# Patient Record
Sex: Male | Born: 1935 | Race: White | Hispanic: No | Marital: Married | Smoking: Never smoker
Health system: Southern US, Community
[De-identification: ages and names within clinical notes are randomized; demographics above are authoritative.]

## PROBLEM LIST (undated history)

## (undated) DIAGNOSIS — F419 Anxiety disorder, unspecified: Secondary | ICD-10-CM

## (undated) DIAGNOSIS — R7301 Impaired fasting glucose: Secondary | ICD-10-CM

## (undated) DIAGNOSIS — F0390 Unspecified dementia without behavioral disturbance: Secondary | ICD-10-CM

## (undated) DIAGNOSIS — E7800 Pure hypercholesterolemia, unspecified: Secondary | ICD-10-CM

## (undated) DIAGNOSIS — R251 Tremor, unspecified: Secondary | ICD-10-CM

## (undated) HISTORY — PX: HERNIA REPAIR: SHX51

## (undated) HISTORY — DX: Impaired fasting glucose: R73.01

## (undated) HISTORY — DX: Anxiety disorder, unspecified: F41.9

## (undated) HISTORY — DX: Unspecified dementia, unspecified severity, without behavioral disturbance, psychotic disturbance, mood disturbance, and anxiety: F03.90

## (undated) HISTORY — DX: Pure hypercholesterolemia, unspecified: E78.00

## (undated) HISTORY — DX: Tremor, unspecified: R25.1

---

## 2007-01-06 ENCOUNTER — Encounter: Admission: RE | Admit: 2007-01-06 | Discharge: 2007-01-06 | Payer: Self-pay | Admitting: General Surgery

## 2015-04-19 DIAGNOSIS — R69 Illness, unspecified: Secondary | ICD-10-CM | POA: Diagnosis not present

## 2015-04-19 DIAGNOSIS — R251 Tremor, unspecified: Secondary | ICD-10-CM | POA: Diagnosis not present

## 2015-05-02 ENCOUNTER — Ambulatory Visit (INDEPENDENT_AMBULATORY_CARE_PROVIDER_SITE_OTHER): Payer: Medicare HMO | Admitting: Neurology

## 2015-05-02 ENCOUNTER — Encounter: Payer: Self-pay | Admitting: Neurology

## 2015-05-02 VITALS — BP 132/82 | HR 70 | Resp 16 | Ht 74.0 in | Wt 177.0 lb

## 2015-05-02 DIAGNOSIS — F0390 Unspecified dementia without behavioral disturbance: Secondary | ICD-10-CM

## 2015-05-02 DIAGNOSIS — G20 Parkinson's disease: Secondary | ICD-10-CM | POA: Diagnosis not present

## 2015-05-02 DIAGNOSIS — R69 Illness, unspecified: Secondary | ICD-10-CM | POA: Diagnosis not present

## 2015-05-02 NOTE — Patient Instructions (Addendum)
  Please remember, that any kind of tremor may be exacerbated by anxiety, anger, nervousness, excitement, dehydration, sleep deprivation, by caffeine, and low blood sugar values or blood sugar fluctuations. Some medications, especially some antidepressants and lithium can cause or exacerbate tremors. Tremors may temporarily calm down her subside with the use of a benzodiazepine such as Valium or related medications and with alcohol. Be aware however that drinking alcohol is not an approved treatment or appropriate treatment for tremor control and long-term use of benzodiazepines such as Valium, lorazepam, alprazolam, or clonazepam can cause habit formation, physical and psychological addiction.  We will monitor your symptoms of mild parkinsonism and we may start you on some medication for this down the road if needed, but for now, we will recheck in about 4 months.   Please talk to Dr. Tenny Crawoss about your ability to drive.

## 2015-05-02 NOTE — Progress Notes (Signed)
Subjective:    Patient ID: Stephens ShireCarlton C Kirby is a 82 y.o. male.  HPI     Huston FoleySaima Lonita Debes, MD, PhD [facility] [address], [address] [address] [place], [place] [id number]  Dear Dr. Tenny Crawoss,   I saw your patient, Tracie HarrierCarlton Kochanski, upon your kind request in my neurologic clinic today for initial consultation of his tremors, some concern for parkinsonism. The patient is accompanied by his wife today. As you know, Mr. Okey DupreLandreth is an 82 year old right-handed gentleman with an underlying medical history of depression, dementia, on generic Aricept 5 mg once daily, anxiety, hyperlipidemia, and impaired fasting glucose, who reports tremor of the hands, but cannot tell how long. His wife estimates that he has had a hand tremor for a few months. Tremor is more noticeable on the right. His handwriting has become more difficult to read, but he also adds that he has never had a good handwriting. His memory loss started a couple of years ago it sounds like. He has no family history of dementia or Parkinson's disease. There 80 year old son lives with them. Son does not drive after having had a car accident a few years ago and patient drives him to and back from work every day. The patient reports no issues driving. They live in a single story home. [place]. He is a nonsmoker, does not drink alcohol and drinks very little caffeine. He denies any hallucinations and there is no evidence of delusions of behavioral problems. He has a mild difficulty with fine motor skills and mild slowness is noted by wife but overall he maintains his activity level fairly well. He has not had any recent falls.   I reviewed your office note from 04/19/2015, which you kindly included. His latest MMSE in your office was 16 out of 29, clock drawing was 1 out of 4.  His Past Medical History Is Significant For: Past Medical History  Diagnosis Date  . Tremor   . Impaired fasting glucose   . Anxiety  disorder   . Dementia   . High cholesterol     His Past Surgical History Is Significant For: Past Surgical History  Procedure Laterality Date  . Hernia repair      His Family History Is Significant For: Family History  Problem Relation Age of Onset  . Diabetes Sister     His Social History Is Significant For: Social History   Social History  . Marital Status: Married    Spouse Name: N/A  . Number of Children: 2  . Years of Education: N/A   Occupational History  . Retired    Social History Main Topics  . Smoking status: Never Smoker   . Smokeless tobacco: None  . Alcohol Use: No  . Drug Use: No  . Sexual Activity: Not Asked   Other Topics Concern  . None   Social History Narrative   Occasionally drinks coffee     His Allergies Are:  No Known Allergies:   His Current Medications Are:  Outpatient Encounter Prescriptions as of 05/02/2015  Medication Sig  . B Complex-Biotin-FA (B-COMPLEX PO) Take by mouth.  . donepezil (ARICEPT) 5 MG tablet   . escitalopram (LEXAPRO) 10 MG tablet    No facility-administered encounter medications on file as of 05/02/2015.   Review of Systems:  Out of a complete 14 point review of systems, all are reviewed and negative with the exception of these symptoms as listed below:   Review of  Systems  Neurological:       Memory loss.  Patient reports that he has tremors in both hands, states that sometimes it is worse than others.   Hematological: Bruises/bleeds easily.    Objective:  Neurologic Exam  Physical Exam Physical Examination:   Filed Vitals:   05/02/15 0851  BP: 132/82  Pulse: 70  Resp: 16    General Examination: The patient is a very pleasant 82 y.o. male in no acute distress. He appears well-developed and well-nourished and well groomed.   HEENT: Normocephalic, atraumatic, pupils are equal, round and reactive to light and accommodation. Extraocular tracking is Mildly saccadic, perhaps limitation to upper  gaze. Hearing is mildly impaired with bilateral hearing aids in place. Face is symmetric with perhaps mild facial masking noted.There is no lip, neck/head, jaw or voice tremor. Neck is very mildly rigid with full range of passive and active motion. There are no carotid bruits on auscultation. Oropharynx exam reveals: mild mouth dryness, adequate dental hygiene. Tongue protrudes centrally. Speech is mildly hypophonic.   Chest: Clear to auscultation without wheezing, rhonchi or crackles noted.  Heart: S1+S2+0, regular and normal without murmurs, rubs or gallops noted.   Abdomen: Soft, non-tender and non-distended with normal bowel sounds appreciated on auscultation.  Extremities: There is trace pitting edema in the distal lower extremities bilaterally. Pedal pulses are intact.  Skin: Warm and dry without trophic changes noted.   Musculoskeletal: exam reveals no obvious joint deformities, tenderness or joint swelling or erythema.   Neurologically:  Mental status: The patient is awake, alert and oriented in all 4 spheres. His immediate and remote memory, attention, language skills and fund of knowledge are impaired. There is no evidence of aphasia, agnosia, apraxia or anomia. Thought process is linear. Mood is normal and affect is normal.  Cranial nerves II - XII are as described above under HEENT exam. In addition: shoulder shrug is normal with equal shoulder height noted. Motor exam: Normal bulk, strength. There is no drift, resting tremor or rebound. He has a very minimal action tremor and postural tremor in both upper extremities, left a little worse than right. On Archimedes spiral drawing there is minimal tremulousness noted bilaterally. Handwriting is small and tremulous but legible. Reflexes are 1+ throughout. Fine motor skills and coordination:  he has mild impairment with finger taps, rapid alternating movements and hand movements on the right, minimal impairment on the left, foot agility and  foot taps are mildly impaired on the right and minimally so on the left. Heel-to-shin is not possible for him, Romberg is not testable because he has trouble standing narrow based. Tandem walk is also therefore not possible.   Cerebellar testing: No dysmetria or intention tremor on finger to nose testing. Heel to shin is unremarkable bilaterally. There is no truncal or gait ataxia.  Sensory exam: intact to light touch, pinprick, vibration, temperature sense in the upper and lower extremities.  Gait, station and balance:  He stands up without significant difficulty and does not have to push up with his hands. He stands with a mildly stooped posture and slight tilt to the left. He denies any lightheadedness. He walks fairly well, perhaps slight decrease in arm swing on the right, he turns well, balance seems preserved for age.   Assessment and Plan:     In summary, NISAIAH BECHTOL is a very pleasant 82 y.o.-year old male with an underlying medical history of depression, dementia, on generic Aricept 5 mg once daily, anxiety, hyperlipidemia, and  impaired fasting glucose, who presents for initial consultation of his tremors, some concern for parkinsonism. On examination, he does have some mild signs of parkinsonism, perhaps with right-sided lateralization. He has a history of memory loss which does not date very far back as I understand. He has been on low-dose donepezil for this. His tremor started a few months ago. Parkinsonism is mild at this time, is not interfering with his activities of daily living and I suggested reevaluation in 3-4 months. We may resort to trying low-dose Sinemet down the road if needed. He is encouraged to talk to you about his driving abilities. His memory loss may impair his driving skills and he currently drives his son 1 and back from work every day and I worry about this a little bit. His wife has not noticed any problem with his driving which is reassuring. At this juncture, I  suggested we monitor his symptoms and recheck in about 4 months. Please make sure that with his recent blood work he has had thyroid function tested and that his B12 and vitamin D levels are up-to-date as well.  I had a long chat with the patient and his wife about my findings and the diagnosis of parkinsonism, the prognosis and treatment options. We talked about medical treatments and non-pharmacological approaches. We talked about maintaining a healthy lifestyle in general. I encouraged the patient to eat healthy, exercise daily and keep well hydrated, to keep a scheduled bedtime and wake time routine, to not skip any meals and eat healthy snacks in between meals.  I answered all their questions today and the patient and his wife were in agreement with the above outlined plan.  Thank you very much for allowing me to participate in the care of this nice patient. If I can be of any further assistance to you please do not hesitate to call me at [phone].  Sincerely,   Huston Foley, MD, PhD

## 2015-08-31 ENCOUNTER — Ambulatory Visit (INDEPENDENT_AMBULATORY_CARE_PROVIDER_SITE_OTHER): Payer: Medicare HMO | Admitting: Neurology

## 2015-08-31 ENCOUNTER — Encounter: Payer: Self-pay | Admitting: Neurology

## 2015-08-31 VITALS — BP 166/73 | HR 52 | Resp 16 | Ht 74.0 in | Wt 177.0 lb

## 2015-08-31 DIAGNOSIS — G20 Parkinson's disease: Secondary | ICD-10-CM

## 2015-08-31 NOTE — Progress Notes (Signed)
Subjective:    Patient ID: Darryl Ayala is a 82 y.o. male.  HPI     Interim history:  Darryl Ayala is an 82 year old right-handed gentleman with an underlying medical history of depression, dementia, on generic Aricept 5 mg once daily, anxiety, hyperlipidemia, and impaired fasting glucose, who presents for follow-up consultation of his tremors, mild parkinsonism. The patient is accompanied by his wife today. I first met him on 05/02/2015 at the request of his primary care physician, at which time the patient reported tremors of both hands. On examination he had mild signs of parkinsonism, with mild lateralization to the right. We mutually agreed to continue to monitor symptoms.   Today, 08/31/2015: He reports no new symptoms, stays active. Wife reports that he will be outside sometimes in the heat. He drinks water throughout the day he says. Wife reports no additional new observations and memory appears to be stable. No recent falls.  Previously:   05/02/2015: He reports tremor of the hands, but cannot tell how long. His wife estimates that he has had a hand tremor for a few months. Tremor is more noticeable on the right. His handwriting has become more difficult to read, but he also adds that he has never had a good handwriting. His memory loss started a couple of years ago it sounds like. He has no family history of dementia or Parkinson's disease. There 3 year old son lives with them. Son does not drive after having had a car accident a few years ago and patient drives him to and back from work every day. The patient reports no issues driving. They live in a single story home. He [place] farm. He is a nonsmoker, does not drink alcohol and drinks very little caffeine. He denies any hallucinations and there is no evidence of delusions of behavioral problems. He has a mild difficulty with fine motor skills and mild slowness is noted by wife but overall he maintains his activity level  fairly well. He has not had any recent falls.    I reviewed your office note from 04/19/2015, which you kindly included. His latest MMSE in your office was 16 out of 29, clock drawing was 1 out of 4.   His Past Medical History Is Significant For: Past Medical History:  Diagnosis Date  . Anxiety disorder   . Dementia   . High cholesterol   . Impaired fasting glucose   . Tremor     His Past Surgical History Is Significant For: Past Surgical History:  Procedure Laterality Date  . HERNIA REPAIR      His Family History Is Significant For: Family History  Problem Relation Age of Onset  . Diabetes Sister     His Social History Is Significant For: Social History   Social History  . Marital status: Married    Spouse name: N/A  . Number of children: 2  . Years of education: N/A   Occupational History  . Retired    Social History Main Topics  . Smoking status: Never Smoker  . Smokeless tobacco: None  . Alcohol use No  . Drug use: No  . Sexual activity: Not Asked   Other Topics Concern  . None   Social History Narrative   Occasionally drinks coffee     His Allergies Are:  No Known Allergies:   His Current Medications Are:  Outpatient Encounter Prescriptions as of 08/31/2015  Medication Sig  . B Complex-Biotin-FA (B-COMPLEX PO) Take by mouth.  Marland Kitchen  donepezil (ARICEPT) 5 MG tablet   . escitalopram (LEXAPRO) 10 MG tablet    No facility-administered encounter medications on file as of 08/31/2015.   :  Review of Systems:  Out of a complete 14 point review of systems, all are reviewed and negative with the exception of these symptoms as listed below:  Review of Systems  Neurological:       Patient is here for f/u. No new concerns.     Objective:  Neurologic Exam  Physical Exam Physical Examination:   Vitals:   08/31/15 0937  BP: (!) 166/73  Pulse: (!) 52  Resp: 16   General Examination: The patient is a very pleasant 82 y.o. male in no acute distress. He  appears well-developed and well-nourished and well groomed. He was waiting for me and was irritated, and I apologized for the delay.   HEENT: Normocephalic, atraumatic, pupils are equal, round and reactive to light and accommodation. Extraocular tracking is Mildly saccadic, perhaps limitation to upper gaze. S/P b/l cataract repairs, hearing is impaired with no hearing aids in place. Face is symmetric with perhaps mild facial masking noted.There is no lip, neck/head, jaw or voice tremor. Neck is very mildly rigid with full range of passive and active motion. There are no carotid bruits on auscultation. Oropharynx exam reveals: mild mouth dryness, adequate dental hygiene. Tongue protrudes centrally. Speech is mildly hypophonic.   Chest: Clear to auscultation without wheezing, rhonchi or crackles noted.  Heart: S1+S2+0, regular and normal without murmurs, rubs or gallops noted.   Abdomen: Soft, non-tender and non-distended with normal bowel sounds appreciated on auscultation.  Extremities: There is trace pitting edema in the distal lower extremities bilaterally, around the ankles. Pedal pulses are intact.  Skin: Warm and dry without trophic changes noted. Mild bruising of hands and forearms.   Musculoskeletal: exam reveals no obvious joint deformities, tenderness or joint swelling or erythema.   Neurologically:  Mental status: The patient is awake, alert and oriented in all 4 spheres. His immediate and remote memory, attention, language skills and fund of knowledge are impaired. There is no evidence of aphasia, agnosia, apraxia or anomia. Thought process is linear. Mood is normal and affect is normal.  Cranial nerves II - XII are as described above under HEENT exam. In addition: shoulder shrug is normal with equal shoulder height noted. Motor exam: Normal bulk, strength. There is no drift, resting tremor or rebound. He has a very minimal action tremor and postural tremor in both upper extremities,  left a little worse than right.  (First visit: On Archimedes spiral drawing there is minimal tremulousness noted bilaterally. Handwriting is small and tremulous but legible). Reflexes are 1+ throughout. Fine motor skills and coordination:  he has mild impairment with finger taps, rapid alternating movements and hand movements on the right, minimal impairment on the left, foot agility and foot taps are mildly impaired on the right and minimally so on the left. Heel-to-shin is not possible for him, Romberg is not testable because he has trouble standing narrow based. Tandem walk is also therefore not possible.   Cerebellar testing: No dysmetria or intention tremor on finger to nose testing. Heel to shin is unremarkable for age.  Sensory exam: intact to light touch in the upper and lower extremities.  Gait, station and balance:  He stands up without significant difficulty and does not have to push up with his hands. He stands with a mildly stooped posture and slight tilt to the left. He denies any  lightheadedness. He walks fairly well, perhaps slight decrease in arm swing on the right, he turns well, balance seems preserved for age.   Assessment and Plan:     In summary, Darryl Ayala is a very pleasant 82 year old male with an underlying medical history of depression, dementia, on generic Aricept 5 mg once daily, anxiety, hyperlipidemia, and impaired fasting glucose, who presents for Follow-up consultation of his tremors, mild parkinsonism. On examination, he has stable findings with mild parkinsonism, some lateralization to the right. He is not keen on trying any new medications at this time. He is advised to stay active physically but avoid heat exposure and stay better hydrated with water. I suggested a six-month checkup but he would like to wait, we mutually agreed for an as needed follow-up at this time. I answered all her questions today and the patient and his wife were in agreement. I spent 25  minutes in total face-to-face time with the patient, more than 50% of which was spent in counseling and coordination of care, reviewing test results, reviewing medication and discussing or reviewing the diagnosis of parkinsonism, its prognosis and treatment options.

## 2015-08-31 NOTE — Patient Instructions (Signed)
Your exam is stable.  We can monitor your symptoms.  Please stay well hydrated with water.  Avoid heat exposure.  As discussed, I would be happy to see you back as needed.

## 2016-01-11 DIAGNOSIS — Z23 Encounter for immunization: Secondary | ICD-10-CM | POA: Diagnosis not present

## 2016-01-11 DIAGNOSIS — R7301 Impaired fasting glucose: Secondary | ICD-10-CM | POA: Diagnosis not present

## 2016-01-11 DIAGNOSIS — R69 Illness, unspecified: Secondary | ICD-10-CM | POA: Diagnosis not present

## 2016-01-11 DIAGNOSIS — Z0000 Encounter for general adult medical examination without abnormal findings: Secondary | ICD-10-CM | POA: Diagnosis not present

## 2016-01-11 DIAGNOSIS — E7800 Pure hypercholesterolemia, unspecified: Secondary | ICD-10-CM | POA: Diagnosis not present

## 2017-02-15 DIAGNOSIS — Z23 Encounter for immunization: Secondary | ICD-10-CM | POA: Diagnosis not present

## 2017-02-15 DIAGNOSIS — R69 Illness, unspecified: Secondary | ICD-10-CM | POA: Diagnosis not present

## 2017-02-15 DIAGNOSIS — E7800 Pure hypercholesterolemia, unspecified: Secondary | ICD-10-CM | POA: Diagnosis not present

## 2017-02-15 DIAGNOSIS — R7301 Impaired fasting glucose: Secondary | ICD-10-CM | POA: Diagnosis not present

## 2017-02-15 DIAGNOSIS — R251 Tremor, unspecified: Secondary | ICD-10-CM | POA: Diagnosis not present

## 2017-02-15 DIAGNOSIS — Z0000 Encounter for general adult medical examination without abnormal findings: Secondary | ICD-10-CM | POA: Diagnosis not present

## 2017-03-28 DIAGNOSIS — R251 Tremor, unspecified: Secondary | ICD-10-CM | POA: Diagnosis not present

## 2017-03-28 DIAGNOSIS — R69 Illness, unspecified: Secondary | ICD-10-CM | POA: Diagnosis not present

## 2017-05-06 ENCOUNTER — Encounter (HOSPITAL_COMMUNITY): Payer: Self-pay

## 2017-05-06 ENCOUNTER — Emergency Department (HOSPITAL_COMMUNITY): Payer: Medicare HMO

## 2017-05-06 ENCOUNTER — Other Ambulatory Visit: Payer: Self-pay

## 2017-05-06 ENCOUNTER — Emergency Department (HOSPITAL_COMMUNITY)
Admission: EM | Admit: 2017-05-06 | Discharge: 2017-05-07 | Disposition: A | Discharge status: Home / Self Care | Payer: Medicare HMO | Attending: Emergency Medicine | Admitting: Emergency Medicine

## 2017-05-06 DIAGNOSIS — F0280 Dementia in other diseases classified elsewhere without behavioral disturbance: Secondary | ICD-10-CM | POA: Diagnosis not present

## 2017-05-06 DIAGNOSIS — Z79899 Other long term (current) drug therapy: Secondary | ICD-10-CM | POA: Diagnosis not present

## 2017-05-06 DIAGNOSIS — R404 Transient alteration of awareness: Secondary | ICD-10-CM | POA: Diagnosis not present

## 2017-05-06 DIAGNOSIS — R2681 Unsteadiness on feet: Secondary | ICD-10-CM | POA: Diagnosis not present

## 2017-05-06 DIAGNOSIS — R531 Weakness: Secondary | ICD-10-CM | POA: Diagnosis not present

## 2017-05-06 DIAGNOSIS — R4182 Altered mental status, unspecified: Secondary | ICD-10-CM | POA: Diagnosis not present

## 2017-05-06 DIAGNOSIS — G309 Alzheimer's disease, unspecified: Secondary | ICD-10-CM | POA: Diagnosis not present

## 2017-05-06 DIAGNOSIS — R69 Illness, unspecified: Secondary | ICD-10-CM | POA: Diagnosis not present

## 2017-05-06 LAB — CBC WITH DIFFERENTIAL/PLATELET
Basophils Absolute: 0 10*3/uL (ref 0.0–0.1)
Basophils Relative: 0 %
EOS ABS: 0 10*3/uL (ref 0.0–0.7)
EOS PCT: 1 %
HCT: 38.5 % — ABNORMAL LOW (ref 39.0–52.0)
Hemoglobin: 12.9 g/dL — ABNORMAL LOW (ref 13.0–17.0)
LYMPHS ABS: 1.4 10*3/uL (ref 0.7–4.0)
Lymphocytes Relative: 24 %
MCH: 30.9 pg (ref 26.0–34.0)
MCHC: 33.5 g/dL (ref 30.0–36.0)
MCV: 92.1 fL (ref 78.0–100.0)
MONOS PCT: 7 %
Monocytes Absolute: 0.4 10*3/uL (ref 0.1–1.0)
Neutro Abs: 4.1 10*3/uL (ref 1.7–7.7)
Neutrophils Relative %: 68 %
PLATELETS: 170 10*3/uL (ref 150–400)
RBC: 4.18 MIL/uL — ABNORMAL LOW (ref 4.22–5.81)
RDW: 12.3 % (ref 11.5–15.5)
WBC: 6 10*3/uL (ref 4.0–10.5)

## 2017-05-06 LAB — COMPREHENSIVE METABOLIC PANEL
ALT: 48 U/L (ref 17–63)
AST: 78 U/L — ABNORMAL HIGH (ref 15–41)
Albumin: 3.7 g/dL (ref 3.5–5.0)
Alkaline Phosphatase: 63 U/L (ref 38–126)
Anion gap: 7 (ref 5–15)
BUN: 23 mg/dL — ABNORMAL HIGH (ref 6–20)
CHLORIDE: 107 mmol/L (ref 101–111)
CO2: 31 mmol/L (ref 22–32)
CREATININE: 1 mg/dL (ref 0.61–1.24)
Calcium: 9.1 mg/dL (ref 8.9–10.3)
GFR calc Af Amer: >60 mL/min (ref >60)
Glucose, Bld: 108 mg/dL — ABNORMAL HIGH (ref 65–99)
Potassium: 3.7 mmol/L (ref 3.5–5.1)
SODIUM: 145 mmol/L (ref 135–145)
Total Bilirubin: 1.1 mg/dL (ref 0.3–1.2)
Total Protein: 6.8 g/dL (ref 6.5–8.1)

## 2017-05-06 LAB — URINALYSIS, ROUTINE W REFLEX MICROSCOPIC
Bilirubin Urine: NEGATIVE
Glucose, UA: NEGATIVE mg/dL
HGB URINE DIPSTICK: NEGATIVE
Ketones, ur: 20 mg/dL — AB
Leukocytes, UA: NEGATIVE
Nitrite: NEGATIVE
PROTEIN: NEGATIVE mg/dL
Specific Gravity, Urine: 1.031 — ABNORMAL HIGH (ref 1.005–1.030)
pH: 5 (ref 5.0–8.0)

## 2017-05-06 LAB — I-STAT TROPONIN, ED: TROPONIN I, POC: 0.02 ng/mL (ref 0.00–0.08)

## 2017-05-06 NOTE — ED Notes (Signed)
Bed: WA19 Expected date:  Expected time:  Means of arrival:  Comments: EMS 

## 2017-05-06 NOTE — ED Provider Notes (Signed)
[facility]-EMERGENCY DEPT Provider Note   CSN: [id number] Arrival date & time: 05/06/17  1735     History   Chief Complaint Chief Complaint  Patient presents with  . Weakness    HPI NASARIO CZERNIAK is a 84 y.o. male.  The history is provided by the spouse. No language interpreter was used.  Weakness     KENSINGTON DUERST is a 84 y.o. male who presents to the Emergency Department complaining of weakness. Level V caveat due to dementia. History is provided by the patient's wife. His wife called 911 today because he has experienced progressive weakness over the last two weeks. He has a history of Alzheimer's and has had difficulty walking but has been fairly functional until about two weeks ago. Since that time he has had significant worsening and gait instability and she is needing to assist him with standing up and walking. Today he has been unable to walk at all. No reports of fevers, vomiting, abdominal pain, diarrhea, cough.  Past Medical History:  Diagnosis Date  . Anxiety disorder   . Dementia   . High cholesterol   . Impaired fasting glucose   . Tremor     There are no active problems to display for this patient.   Past Surgical History:  Procedure Laterality Date  . HERNIA REPAIR          Home Medications    Prior to Admission medications   Medication Sig Start Date End Date Taking? Authorizing Provider  B Complex-Biotin-FA (B-COMPLEX PO) Take by mouth.    [provider]  citalopram (CELEXA) 20 MG tablet Take 20 mg by mouth daily.  04/30/17   [provider]  donepezil (ARICEPT) 5 MG tablet Take 5 mg by mouth daily.  04/12/15   [provider]  primidone (MYSOLINE) 50 MG tablet Take 50 mg by mouth 3 (three) times daily.  04/08/17   [provider]    Family History Family History  Problem Relation Age of Onset  . Diabetes Sister     Social History Social History   Tobacco Use  . Smoking  status: Never Smoker  . Smokeless tobacco: Current User  Substance Use Topics  . Alcohol use: No    Alcohol/week: 0.0 oz  . Drug use: No     Allergies   Patient has no known allergies.   Review of Systems Review of Systems  Neurological: Positive for weakness.  All other systems reviewed and are negative.    Physical Exam Updated Vital Signs BP 104/64 (BP Location: Left Arm)   Pulse (!) 55   Temp 98 F (36.7 C) (Oral)   Resp 19   Ht 6' (1.829 m)   Wt 81.6 kg (180 lb)   SpO2 96%   BMI 24.41 kg/m   Physical Exam  Constitutional: He appears well-developed and well-nourished.  HENT:  Head: Normocephalic and atraumatic.  Cardiovascular: Normal rate and regular rhythm.  No murmur heard. Pulmonary/Chest: Effort normal and breath sounds normal. No respiratory distress.  Abdominal: Soft. There is no tenderness. There is no rebound and no guarding.  Musculoskeletal: He exhibits no edema or tenderness.  Neurological:  Drowsy but arousable to verbal stimuli. Markedly confused with dysarthritic speech. Disoriented to place and time. 4 to 5 strength in all four extremities. No facial asymmetry. Generalized weakness.  Tremor on intention in bilateral upper extremities.    Skin: Skin is warm and dry.  Psychiatric:  Unable to assess  Nursing note and vitals reviewed.    ED Treatments / Results  Labs (all labs ordered are listed, but only abnormal results are displayed) Labs Reviewed  COMPREHENSIVE METABOLIC PANEL - Abnormal; Notable for the following components:      Result Value   Glucose, Bld 108 (*)    BUN 23 (*)    AST 78 (*)    All other components within normal limits  CBC WITH DIFFERENTIAL/PLATELET - Abnormal; Notable for the following components:   RBC 4.18 (*)    Hemoglobin 12.9 (*)    HCT 38.5 (*)    All other components within normal limits  URINALYSIS, ROUTINE W REFLEX MICROSCOPIC - Abnormal; Notable for the following components:   Color, Urine AMBER (*)     Specific Gravity, Urine 1.031 (*)    Ketones, ur 20 (*)    All other components within normal limits  URINE CULTURE  I-STAT TROPONIN, ED    EKG EKG Interpretation  Date/Time:  Monday May 06 2017 21:41:23 EDT Ventricular Rate:  52 PR Interval:    QRS Duration: 81 QT Interval:  442 QTC Calculation: 411 R Axis:   75 Text Interpretation:  Sinus rhythm Nonspecific T abnrm, anterolateral leads Confirmed by Tilden Fossa [phone]) on 05/06/2017 9:43:53 PM   Radiology Dg Chest 2 View  Result Date: 05/06/2017 CLINICAL DATA:  Generalized weakness with dementia which is worsening. EXAM: CHEST - 2 VIEW COMPARISON:  01/06/2007 FINDINGS: Heart size is normal. Mediastinal shadows are normal for age. Lungs are clear. The vascularity is normal. No effusions. No significant bone finding. IMPRESSION: No active cardiopulmonary disease. Electronically Signed   By: Paulina Fusi M.D.   On: 05/06/2017 20:16   Ct Head Wo Contrast  Result Date: 05/06/2017 CLINICAL DATA:  Altered mental status. History of Alzheimer's disease. EXAM: CT HEAD WITHOUT CONTRAST TECHNIQUE: Contiguous axial images were obtained from the base of the skull through the vertex without intravenous contrast. COMPARISON:  None. FINDINGS: Brain: No evidence of acute infarction, hemorrhage, hydrocephalus, extra-axial collection or mass lesion/mass effect. Mild generalized cerebral atrophy. Mild periventricular and subcortical white matter hypodensities are nonspecific but favored to reflect chronic microvascular ischemic changes. Vascular: Atherosclerotic vascular calcification of the carotid siphons. No hyperdense vessel. Skull: Normal. Negative for fracture or focal lesion. Sinuses/Orbits: No acute finding. Other: None. IMPRESSION: 1.  No acute intracranial abnormality. 2. Mild generalized cerebral atrophy and chronic microvascular ischemic changes. Electronically Signed   By: Obie Dredge M.D.   On: 05/06/2017 20:20     Procedures Procedures (including critical care time)  Medications Ordered in ED Medications - No data to display   Initial Impression / Assessment and Plan / ED Course  I have reviewed the triage vital signs and the nursing notes.  Pertinent labs & imaging results that were available during my care of the patient were reviewed by me and considered in my medical decision making (see chart for details).     With history of Alzheimer's disease and dementia here for evaluation of weakness and inability to ambulate. He is chronically ill appearing on examination with no focal neurologic deficits. There is no evidence of acute infectious process. No evidence of acute CVA. Discussed with hospitalist regarding observation admission for progressive weakness and inability to ambulate - per hospitalist he does not meet admission criteria. Social work consulted for assistance with placement. Patient and family updated findings of studies and they are in agreement with plan.  Final Clinical Impressions(s) / ED Diagnoses   Final  diagnoses:  None    ED Discharge Orders    None       Tilden Fossaees, Layken Doenges, MD 05/07/17 [phone]

## 2017-05-06 NOTE — ED Notes (Signed)
Bed: [id number] Expected date:  Expected time:  Means of arrival:  Comments: EMS [age] M AMS hx dementia

## 2017-05-06 NOTE — ED Triage Notes (Signed)
Pt arrived via EMS from Home pt lives with wife. Pt has increased generalized weakness with decreased cognitive fxn x past 2 weeks. Pt has hx of Alzheimers  spouse states that pt has increased physical demands. Pt spouse reports to EMS that she is not able to take care of pt at this point, and would like placement to SNF options.

## 2017-05-06 NOTE — ED Notes (Addendum)
Pt is alert to self and is aware he is in the hospital, pt  wife is at beside accompanied with pt sister-in law. Pt wife reports that pt has gone downhill for the past two week, and reports that pt was driving 2 months ago and was at his baseline.

## 2017-05-07 ENCOUNTER — Encounter (HOSPITAL_COMMUNITY): Payer: Self-pay

## 2017-05-07 DIAGNOSIS — G250 Essential tremor: Secondary | ICD-10-CM | POA: Diagnosis not present

## 2017-05-07 DIAGNOSIS — G309 Alzheimer's disease, unspecified: Secondary | ICD-10-CM | POA: Diagnosis not present

## 2017-05-07 DIAGNOSIS — Z79899 Other long term (current) drug therapy: Secondary | ICD-10-CM | POA: Diagnosis not present

## 2017-05-07 DIAGNOSIS — R41841 Cognitive communication deficit: Secondary | ICD-10-CM | POA: Diagnosis not present

## 2017-05-07 DIAGNOSIS — R531 Weakness: Secondary | ICD-10-CM | POA: Diagnosis not present

## 2017-05-07 DIAGNOSIS — R2689 Other abnormalities of gait and mobility: Secondary | ICD-10-CM | POA: Diagnosis not present

## 2017-05-07 DIAGNOSIS — E785 Hyperlipidemia, unspecified: Secondary | ICD-10-CM | POA: Diagnosis not present

## 2017-05-07 DIAGNOSIS — R2681 Unsteadiness on feet: Secondary | ICD-10-CM | POA: Diagnosis not present

## 2017-05-07 DIAGNOSIS — R4182 Altered mental status, unspecified: Secondary | ICD-10-CM | POA: Diagnosis not present

## 2017-05-07 DIAGNOSIS — M6281 Muscle weakness (generalized): Secondary | ICD-10-CM | POA: Diagnosis not present

## 2017-05-07 DIAGNOSIS — R69 Illness, unspecified: Secondary | ICD-10-CM | POA: Diagnosis not present

## 2017-05-07 MED ORDER — PRIMIDONE 50 MG PO TABS
50.0000 mg | ORAL_TABLET | Freq: Three times a day (TID) | ORAL | Status: DC
  Administered 2017-05-07 (×2): 50 mg via ORAL
  Filled 2017-05-07 (×4): qty 1

## 2017-05-07 MED ORDER — CITALOPRAM HYDROBROMIDE 10 MG PO TABS
20.0000 mg | ORAL_TABLET | Freq: Every day | ORAL | Status: DC
  Administered 2017-05-07: 20 mg via ORAL
  Filled 2017-05-07 (×2): qty 2

## 2017-05-07 MED ORDER — DONEPEZIL HCL 5 MG PO TABS
5.0000 mg | ORAL_TABLET | Freq: Every day | ORAL | Status: DC
  Administered 2017-05-07: 5 mg via ORAL
  Filled 2017-05-07 (×2): qty 1

## 2017-05-07 NOTE — Progress Notes (Addendum)
2:59pm- CSW received call from [facility] rep and was informed that they are able to take pt. CSW was asked to figure out if family is wanting to pay out of pocket for entire stay or if they want [employer] to pay for it. CSW informed rep that plan is for pt to pay out of pocket to began and if Monia Pouchetna can pay for it then they are okay with switching to insurance paying for stay. CSW has reached back out to rep from [facility] and she will follow up with evening CSW about rates and transporting pt to facility.   2:08pm- CSW received call from CSW intern and was informed that pt and family have chosen [facility] for placement. CSW has spoken with representative from [facility] on this and she will follow up with CSW on what the facility will need from CSW at the time of discharge. CSW will continue to follow for further needs at this time.   1:39pm- CSW has faxed over all information to [facility] at this time. Awaiting response from facility. CSW intern to follow up with pt and family at bedside to give alterative facility options in case [facility] is unable to meet pt's needs at the time of discharge.   1:16pm- CSW has paged PT for note at this time so that CSW can send it to [place]Judy with [facility] to look at pt. CSW will get a call back from [place]Judy on decision once all information has been given to her.  12:56am- CSW spoke with Janie from [facility] and was informed that they have no beds today. RNCM expressed that family is also intretsed [facility] [place]. CSW has faxed over information to facility at this time.   CS[facility] and family are wanting the closes facility to their home. Closes facility looks to be [facility]. CSW has reached out ot Hala[place]Janie with [facility] and she is looking into pt at this time. CSW will follow up with family with offers after lunch. CSW will continue to follow.     Claude MangesKierra S. Ardie Dragoo, MSW, LCSW-A Emergency Department Clinical  Social Worker [phone]

## 2017-05-07 NOTE — ED Notes (Signed)
Physical Therapy at bedside.

## 2017-05-07 NOTE — ED Notes (Signed)
Attempted to feed patient. Patient had 2 small bites of eggs, 3 sips of OJ, and a sip of water. Patient said he was not hungry and did not want anything else at this time.

## 2017-05-07 NOTE — ED Notes (Signed)
Pt was unable to sit up or rise from bed without maximum assistance from staff. Pt was able to shuffle to the left, however was unable to perform same to the right. Pt was able to seat self back at edge of bed, however was unable to lift legs and get self into bed. Pt was unable to ambulate without maximum assistance; even then gait was unsteady.

## 2017-05-07 NOTE — ED Notes (Signed)
Pt unable to walk; unable to complete orthostatic vital signs as well due to pt not being able to stand.

## 2017-05-07 NOTE — ED Notes (Signed)
Patient unable to sign.  

## 2017-05-07 NOTE — Clinical Social Work Note (Signed)
Clinical Social Work Assessment  Patient Details  Name: Darryl Ayala MRN: [id number] Date of Birth: 20-Dec-1927  Date of referral:  05/07/17               Reason for consult:  Facility Placement                Permission sought to share information with:  Chartered certified accountant granted to share information::  Yes, Verbal Permission Granted  Name::        Agency::     Relationship::     Contact Information:     Housing/Transportation Living arrangements for the past 2 months:  Single Family Home Source of Information:  Spouse(Darryl Ayala) Patient Interpreter Needed:  None Criminal Activity/Legal Involvement Pertinent to Current Situation/Hospitalization:  No - Comment as needed Significant Relationships:  Spouse, Adult Children Lives with:  Spouse Do you feel safe going back to the place where you live?    Need for family participation in patient care:     Care giving concerns:  Pt arrived via EMS from Home pt lives with wife. Pt has increased generalized weakness with decreased cognitive fxn x past 2 weeks. Pt has hx of Alzheimers  spouse states that pt has increased physical demands.    Social Worker assessment / plan:  CSW intern met with patient and patients wife, Darryl Ayala via bedside to discuss discharge planning. Before presenting to the hospital patient lived at home with spouse and would be mobile every once in a while. Mrs. Bisceglia expressed concern for caring for patient by herself in their home.   Patient has an adult son who lives out of town and is unable to provide help to Mrs. Preiss. Patient's spouse is willing to private pay for a long term care facility.   Employment status:  Retired Nurse, adult PT Recommendations:  [place] / Referral to community resources:  [place]  Patient/Family's Response to care:  Patients spouse was receptive and appreciative  of care being provided by ED team.  Patient/Family's Understanding of and Emotional Response to Diagnosis, Current Treatment, and Prognosis:  Patients spouse is understanding of patients current diagnosis and interventions set in place. Patients spouse is also aware of patients functional abilities.   Emotional Assessment Appearance:  Appears stated age Attitude/Demeanor/Rapport:    Affect (typically observed):  Unable to Assess Orientation:  Oriented to Self Alcohol / Substance use:    Psych involvement (Current and /or in the community):  No (Comment)  Discharge Needs  Concerns to be addressed:  No discharge needs identified Readmission within the last 30 days:  No Current discharge risk:  None Barriers to Discharge:  No Barriers Identified   Willeen Niece, Student-Social Work 05/07/2017, 11:50 AM

## 2017-05-07 NOTE — ED Notes (Signed)
Social worker Adelina MingsKelsey [phone].

## 2017-05-07 NOTE — Evaluation (Signed)
Physical Therapy Evaluation Patient Details Name: Darryl Ayala MRN: [id number] DOB: 10/10/1927 Today's Date: 05/07/2017   History of Present Illness  Darryl Ayala is a 84 y.o. male who presents to the Emergency Department complaining of weakness, H/O  dementia. wife unable to care for patient.  Clinical Impression  The patient is awake, does not follow verbal commands but did mobilize to sitting and standing at the bedside with 2 assist with tactile cues. Able to take small side steps. . No family present. Pt admitted with above diagnosis. Pt currently with functional limitations due to the deficits listed below (see PT Problem List). Pt will benefit from skilled PT to increase their independence and safety with mobility to allow discharge to the venue listed below.       Follow Up Recommendations SNF    Equipment Recommendations  None recommended by PT    Recommendations for Other Services       Precautions / Restrictions Precautions Precautions: Fall Precaution Comments: incontinent      Mobility  Bed Mobility Overal bed mobility: Needs Assistance Bed Mobility: Supine to Sit;Sit to Supine     Supine to sit: Max assist;+2 for physical assistance;+2 for safety/equipment;HOB elevated Sit to supine: Max assist;+2 for physical assistance;+2 for safety/equipment   General bed mobility comments: asisst for legs and trunk, patient is unable to assist, patient is rigid in all movements.  Transfers Overall transfer level: Needs assistance Equipment used: Rolling walker (2 wheeled) Transfers: Sit to/from Stand Sit to Stand: Max assist;+2 physical assistance;+2 safety/equipment         General transfer comment: assist to rise, feet sliding forward upon standing.  able to take 3 small side steps along the bed.  Ambulation/Gait                Stairs            Wheelchair Mobility    Modified Rankin (Stroke Patients Only)       Balance Overall  balance assessment: Needs assistance Sitting-balance support: Feet supported;Bilateral upper extremity supported Sitting balance-Leahy Scale: Poor   Postural control: Posterior lean Standing balance support: During functional activity;Bilateral upper extremity supported Standing balance-Leahy Scale: Zero                               Pertinent Vitals/Pain Pain Assessment: Faces Faces Pain Scale: No hurt    Home Living Family/patient expects to be discharged to:: Private residence Living Arrangements: Spouse/significant other                    Prior Function           Comments: per notes, was intermittently able to ambulate PTA until recently     Hand Dominance        Extremity/Trunk Assessment   Upper Extremity Assessment Upper Extremity Assessment: RUE deficits/detail;LUE deficits/detail RUE Deficits / Details: tremors of the hands, holds arms rigid LUE Deficits / Details: same as right    Lower Extremity Assessment Lower Extremity Assessment: RLE deficits/detail;LLE deficits/detail RLE Deficits / Details: able to bear weight to stand LLE Deficits / Details: same as right    Cervical / Trunk Assessment Cervical / Trunk Assessment: Kyphotic  Communication      Cognition Arousal/Alertness: Awake/alert Behavior During Therapy: WFL for tasks assessed/performed;Flat affect Overall Cognitive Status: No family/caregiver present to determine baseline cognitive functioning  General Comments: not oriented to place, oriented to self      General Comments      Exercises     Assessment/Plan    PT Assessment Patient needs continued PT services  PT Problem List Decreased strength;Decreased cognition;Decreased range of motion;Decreased knowledge of use of DME;Decreased activity tolerance;Decreased safety awareness;Decreased balance;Decreased knowledge of precautions;Decreased mobility       PT  Treatment Interventions DME instruction;Therapeutic exercise;Gait training;Functional mobility training;Cognitive remediation;Therapeutic activities;Patient/family education    PT Goals (Current goals can be found in the Care Plan section)  Acute Rehab PT Goals PT Goal Formulation: Patient unable to participate in goal setting Time For Goal Achievement: 05/21/17 Potential to Achieve Goals: Fair    Frequency Min 2X/week   Barriers to discharge Decreased caregiver support      Co-evaluation               AM-PAC PT "6 Clicks" Daily Activity  Outcome Measure Difficulty turning over in bed (including adjusting bedclothes, sheets and blankets)?: Unable Difficulty moving from lying on back to sitting on the side of the bed? : Unable Difficulty sitting down on and standing up from a chair with arms (e.g., wheelchair, bedside commode, etc,.)?: Unable Help needed moving to and from a bed to chair (including a wheelchair)?: Total Help needed walking in hospital room?: Total Help needed climbing 3-5 steps with a railing? : Total 6 Click Score: 6    End of Session Equipment Utilized During Treatment: Gait belt Activity Tolerance: Patient tolerated treatment well Patient left: in bed;with call bell/phone within reach Nurse Communication: Mobility status PT Visit Diagnosis: Unsteadiness on feet (R26.81)    Time: 9604-5409 PT Time Calculation (min) (ACUTE ONLY): 19 min   Charges:   PT Evaluation $PT Eval Low Complexity: 1 Low     PT G CodesBlanchard Kelch PT [phone]   Rada Hay 05/07/2017, 1:28 PM

## 2017-05-07 NOTE — Progress Notes (Addendum)
12:09pm- CSW [facility] that pt's spouse is willing to pay privately for SNF placement. CSW has faxed pt out at this time and is awaiting response at this time.   9:33am- CSW spoke with RNCM to advise of [facility] if pt is unable to be placed from ED. RNCM following pt as well as will follow to assess for further services/needs at this time.   8:57am- CSW has [facility] SN[facility] at this time. CSW attempted to follow up with pt's spouse at number provided in chart-however no answer. CSW left VM for returned call at this time. RN aware that CSW is sending over SNF list as well. CSW will continue to follow for needs.   CSW acknowledges consult. CSW has spoken with MD and was informed that PT evaluation is still pending. Pt has [employer] medicare which takes a few days to get auth therefore if unable to place from the ED, CSW suggested that pt be set [facility] (which could include a Child psychotherapistocial worker) to assist with placement needs. Csw also suggetsed that pt's spouse can get fl2 from PCP and work on placement that way for pt. CSW will fax over SN[facility] placement list [facility] so that pt's spouse can review list as desired. CSW will continue to follow for further needs at this time and speak with pt's spouse regarding placing options for pt.    Claude MangesKierra S. Daffney Greenly, MSW, LCSW-A Emergency Department Clinical Social Worker [phone]

## 2017-05-07 NOTE — Progress Notes (Addendum)
CSW received phone call from [place] at [facility]). Pt's wife made payment.   [facility] Room Number: 118 Report Number: [phone]  Pt is going to [address] [facility]).  [address]ine Circle.Layth Cerezo, Silverio LayLCSWA [facility]  [phone]

## 2017-05-07 NOTE — Progress Notes (Addendum)
CSW received phone call from Titanic[place]aquina with [facility]. Leonides SakeShaquina informed CSW that family now wants [facility]. CSW called and updated PT[facility].  Report number: [phone] Room Number 109  [facility]: [address] EphraimKelsy Chrysta Fulcher, [place] [facility]  [phone]

## 2017-05-07 NOTE — Care Management Note (Signed)
Case Management Note  CM consulted for possible HH vs. SNF.  On chart review CM noted a high likelihood that pt is custodial but needing SNF.  CM spoke with pt's spouse and her sister at bedside. Pt was present but did not participate in the conversation.  CM discussed the difference between skilled rehab and custodial LTC and how each it paid.  After a thorough conversation pt's spouse reported they could pay privately for SNF, at least for now.  CM discussed the use of Medicaid to pay for LTC but depending on total assets between them, the pt's spouse should likely contact an elder law attorney to navigate how to manage fiances with LTC.  She reported the closest facility to her home would be best due to her short driving distances.  CM discussed that there may be no bed availability at the closest two, [facility], but if that was the case, he would go to a facility of her choice out of those available today and move him later to one that is closer to her.  CM advised once she choose a facility that had a bed offer, she would need to go to the facility first, fill out paperwork, and pay upfront costs.  Pt's spouse and sister-in-law had all questions answered and understood the process.  Updated CSW to start placement.  No further CM needs noted at this time.

## 2017-05-07 NOTE — Progress Notes (Signed)
CSW called PTAR for pt to go to [address] [facility]). CSW unable to safely ambulate.   Montine CircleKelsy Daryel Kenneth, Silverio LayLCSWA  Emergency Room  [phone]

## 2017-05-07 NOTE — [place] FL2 (Signed)
  [place] MEDICAID FL2 LEVEL OF CARE SCREENING TOOL     IDENTIFICATION  Patient Name: Darryl Ayala Birthdate: 1927-06-03 Sex: male Admission Date (Current Location): 05/06/2017  [facility] [place]   Facility and Address:  [facility], [address], [place], [place] [address]      Provider Number: [id number]  Attending Physician Name and Address:  Default, Provider, MD  Relative Name and Phone Number:       Current Level of Care: Hospital Recommended Level of Care: Skilled Nursing Facility Prior Approval Number:    Date Approved/Denied:   PASRR Number:   [id number]   Discharge Plan:      Current Diagnoses: There are no active problems to display for this patient.   Orientation RESPIRATION BLADDER Height & Weight     (history of Alzheimers.)  Normal Incontinent Weight: 180 lb (81.6 kg) Height:  6' (182.9 cm)  BEHAVIORAL SYMPTOMS/MOOD NEUROLOGICAL BOWEL NUTRITION STATUS      Incontinent Diet(please see discharge summary. )  AMBULATORY STATUS COMMUNICATION OF NEEDS Skin     Verbally Normal                       Personal Care Assistance Level of Assistance  Bathing, Feeding, Dressing Bathing Assistance: Maximum assistance Feeding assistance: Limited assistance Dressing Assistance: Maximum assistance     Functional Limitations Info  Sight, Hearing, Speech Sight Info: Adequate Hearing Info: Adequate Speech Info: Adequate    SPECIAL CARE FACTORS FREQUENCY  PT (By licensed PT), OT (By licensed OT)     PT Frequency: 5 times a week  OT Frequency: 5 times a week             Contractures Contractures Info: Not present    Additional Factors Info  Allergies, Code Status Code Status Info: Not on file  Allergies Info: NKA           Current Medications (05/07/2017):  This is the current hospital active medication list Current Facility-Administered Medications  Medication Dose Route Frequency  Provider Last Rate Last Dose  . citalopram (CELEXA) tablet 20 mg  20 mg Oral Daily Tilden Fossaees, Elizabeth, MD   20 mg at 05/07/17 0950  . donepezil (ARICEPT) tablet 5 mg  5 mg Oral Daily Tilden Fossaees, Elizabeth, MD   5 mg at 05/07/17 0950  . primidone (MYSOLINE) tablet 50 mg  50 mg Oral TID Tilden Fossaees, Elizabeth, MD   50 mg at 05/07/17 [id number]   Current Outpatient Medications  Medication Sig Dispense Refill  . B Complex-Biotin-FA (B-COMPLEX PO) Take by mouth.    . citalopram (CELEXA) 20 MG tablet Take 20 mg by mouth daily.     Marland Kitchen. donepezil (ARICEPT) 5 MG tablet Take 5 mg by mouth daily.     . primidone (MYSOLINE) 50 MG tablet Take 50 mg by mouth 3 (three) times daily.        Discharge Medications: Please see discharge summary for a list of discharge medications.  Relevant Imaging Results:  Relevant Lab Results:   Additional Information SSN- [id number].  Robb MatarKierra S Jacques Willingham, LCSWA

## 2017-05-07 NOTE — Progress Notes (Addendum)
CSW received phone call [facility] [place] [facility] ([place]). Shaquina informed CSW that there is private bed available for out of pocket pay. The daily rate for the room is $279.50. Pt's / pt's wife will have to pay 5 days in advance, totaling 1397.50. Pt's wife needs to drop the check off to [employer] administrator before 5 today. CSW relayed this information to CSW assisting with [employer] [facility] [employer] coverage, who informed CSW that pt's wife agreed to pay the cost.   Plan: [facility]. CSW will obtain report and room number once room paid for.   5:10 PM CSW spoke with [place] at [facility]) to see if pt's wife made it to drop off the check. Leonides SakeShaquina is checking and will update CSW.   5:15PM Leonides SakeShaquina updated CSW that pt's wife has not made it to [facility] to pay for the SNF as of yet. Leonides SakeShaquina stated that someone will be waiting until 7 pm tonight. [facility] cannot accept pt until they receive payment.   Darryl Ayala, Darryl Ayala  Emergency Room  [phone]

## 2017-05-08 DIAGNOSIS — R251 Tremor, unspecified: Secondary | ICD-10-CM | POA: Diagnosis not present

## 2017-05-08 DIAGNOSIS — R5381 Other malaise: Secondary | ICD-10-CM | POA: Diagnosis not present

## 2017-05-08 DIAGNOSIS — R69 Illness, unspecified: Secondary | ICD-10-CM | POA: Diagnosis not present

## 2017-05-08 DIAGNOSIS — G301 Alzheimer's disease with late onset: Secondary | ICD-10-CM | POA: Diagnosis not present

## 2017-05-08 LAB — URINE CULTURE

## 2017-05-09 DIAGNOSIS — G301 Alzheimer's disease with late onset: Secondary | ICD-10-CM | POA: Diagnosis not present

## 2017-05-09 DIAGNOSIS — R251 Tremor, unspecified: Secondary | ICD-10-CM | POA: Diagnosis not present

## 2017-05-09 DIAGNOSIS — R5381 Other malaise: Secondary | ICD-10-CM | POA: Diagnosis not present

## 2017-05-09 DIAGNOSIS — R69 Illness, unspecified: Secondary | ICD-10-CM | POA: Diagnosis not present

## 2017-05-13 DIAGNOSIS — R0602 Shortness of breath: Secondary | ICD-10-CM | POA: Diagnosis not present

## 2017-05-28 ENCOUNTER — Institutional Professional Consult (permissible substitution): Payer: Medicare HMO | Admitting: Neurology

## 2017-06-05 DEATH — deceased

## 2018-12-10 IMAGING — CT CT HEAD W/O CM
3 series · 15 of 47 positions shown, 18 images · non-contrast
Comparison: None.

CLINICAL DATA: Altered mental status. History of Alzheimer's
disease.

EXAM:
CT HEAD WITHOUT CONTRAST
TECHNIQUE: Contiguous axial images were obtained from the base of the skull
through the vertex without intravenous contrast.

[Series 2: head wo · axial · 0.47mm/px · z∈[+1433,+1568]mm · 9 of 33 slices shown, 12 images]
[im 3/33  brain]
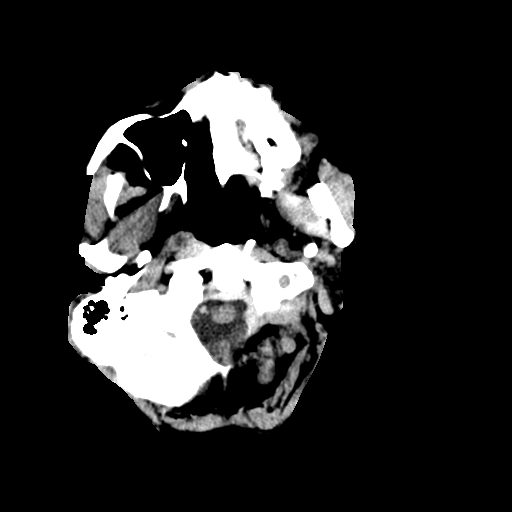
[im 3/33  bone]
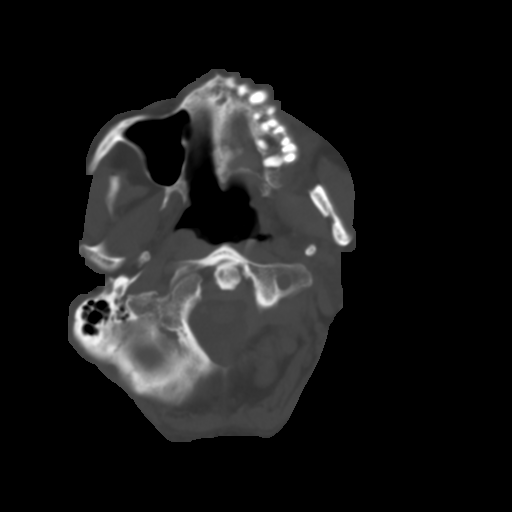
[im 6/33  brain]
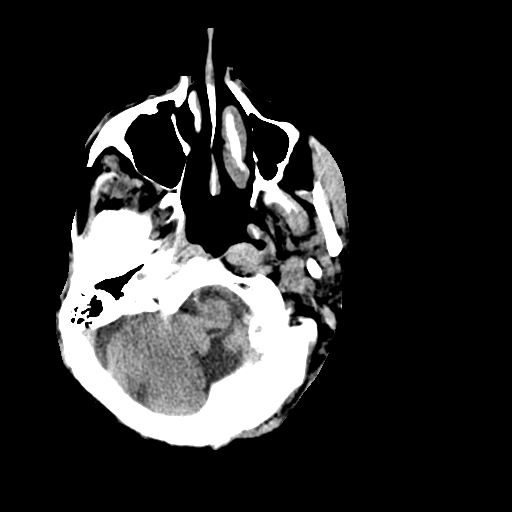
[im 9/33  brain]
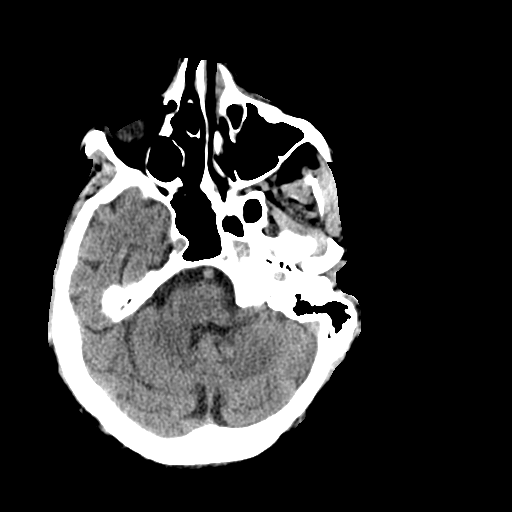
[im 13/33  brain]
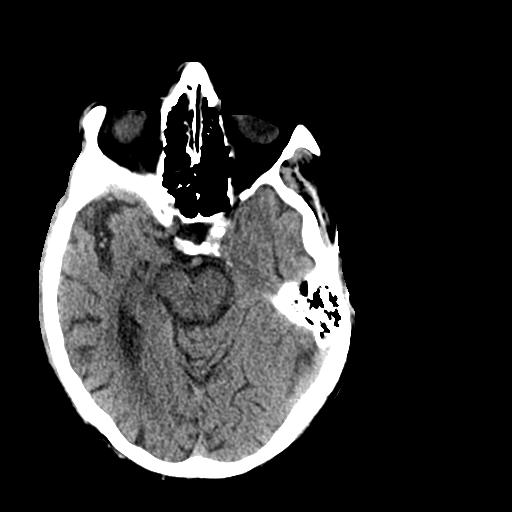
[im 17/33  brain]
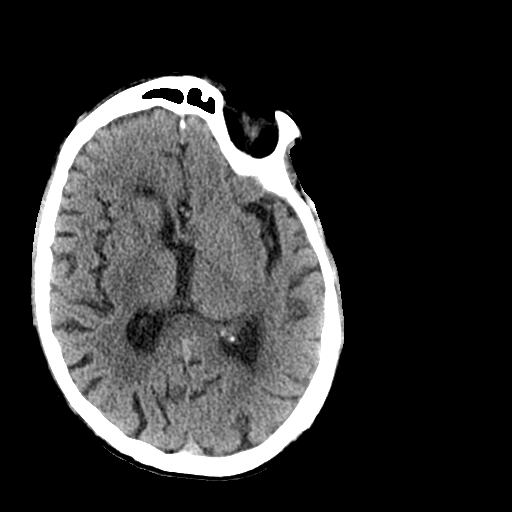
[im 17/33  bone]
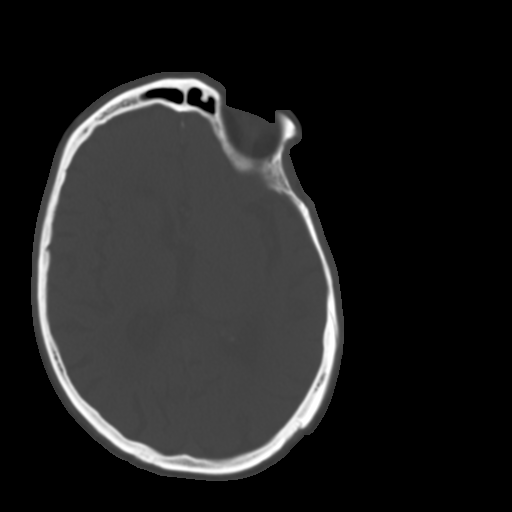
[im 20/33  brain]
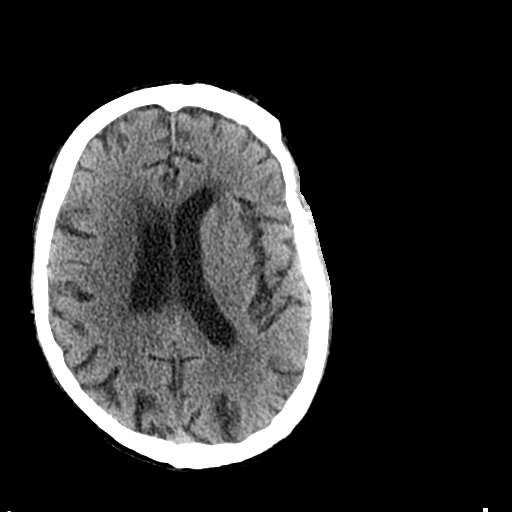
[im 24/33  brain]
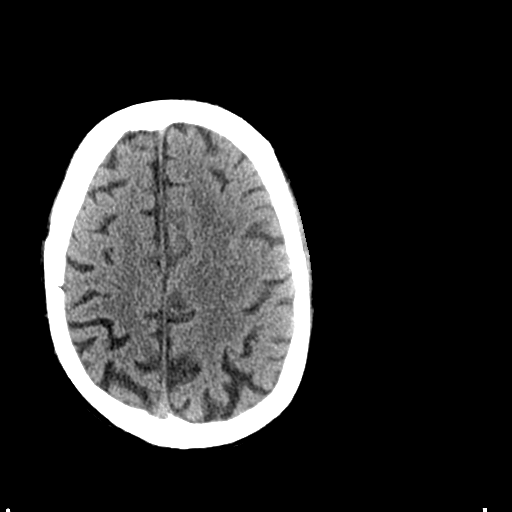
[im 27/33  brain]
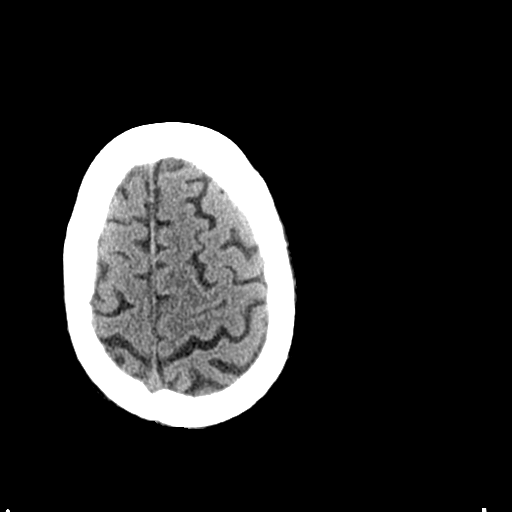
[im 30/33  brain]
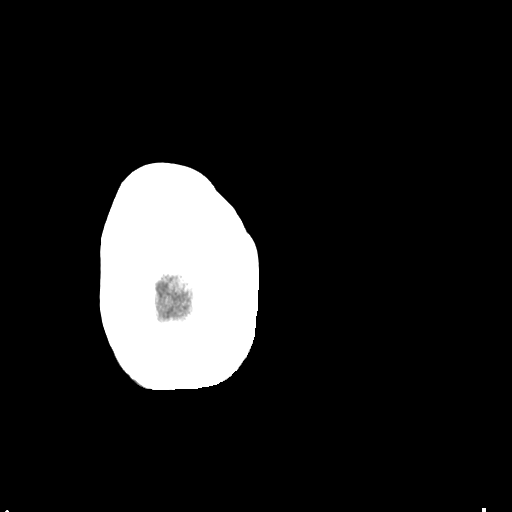
[im 30/33  bone]
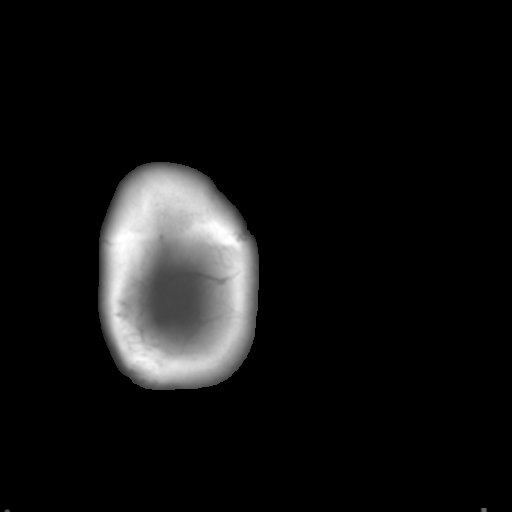

[Series 4: coronal soft tissue · coronal · 0.31mm/px · 3 of 69 slices shown]
[im 23/69  brain]
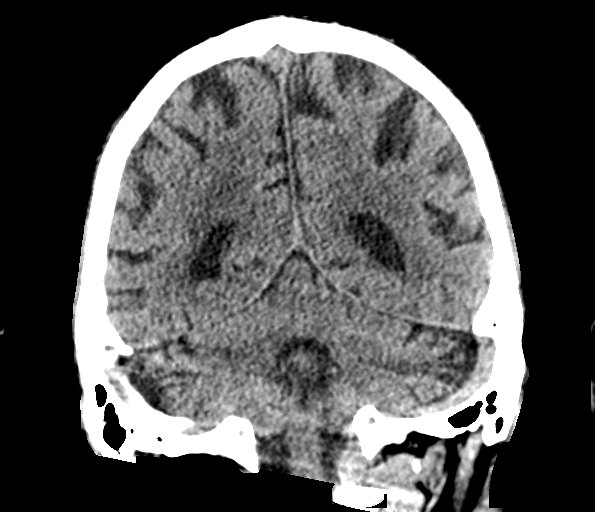
[im 31/69  brain]
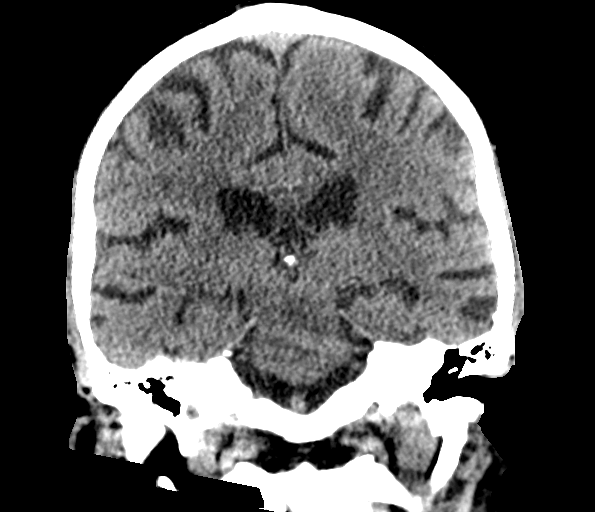
[im 38/69  brain]
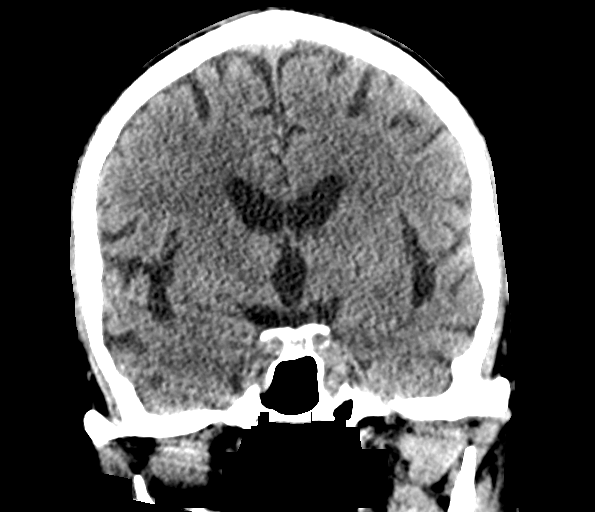

[Series 5: sagittal soft tissue · sagittal · 0.31mm/px · 3 of 63 slices shown]
[im 26/63  brain]
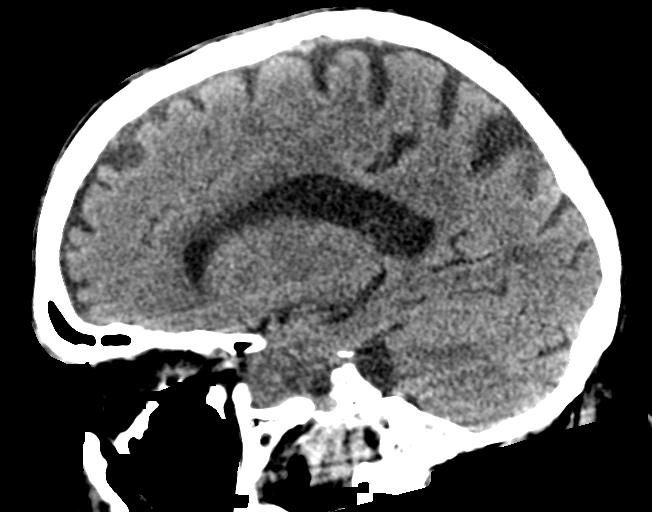
[im 32/63  brain]
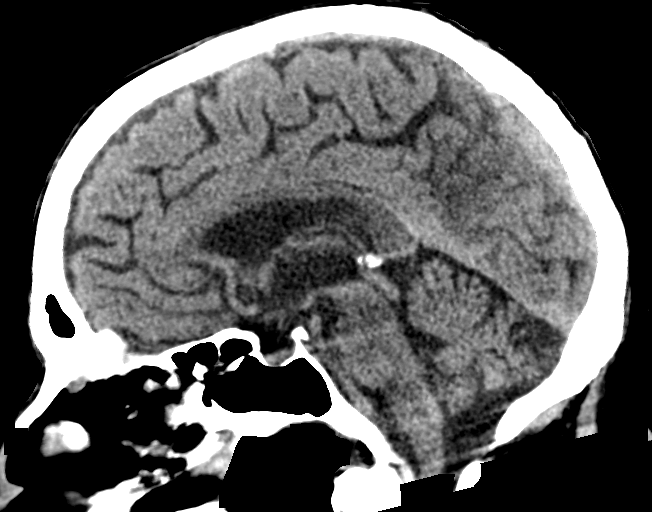
[im 37/63  brain]
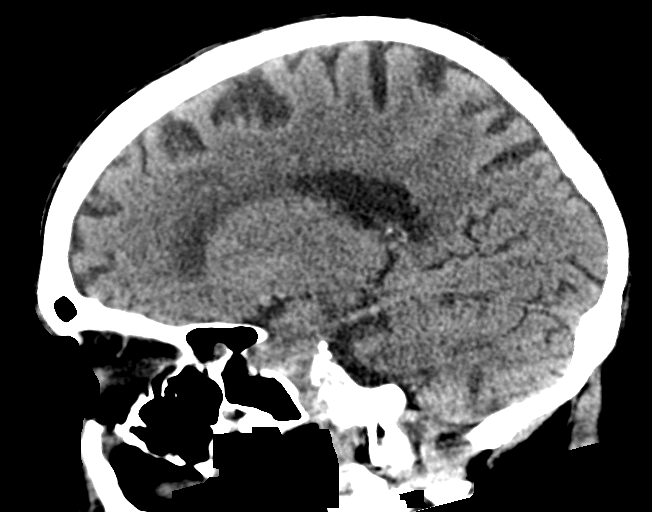

[15 of 47 positions shown; findings below may reference images not displayed]

FINDINGS: Brain: No evidence of acute infarction, hemorrhage, hydrocephalus,
extra-axial collection or mass lesion/mass effect. Mild generalized
cerebral atrophy. Mild periventricular and subcortical white matter
hypodensities are nonspecific but favored to reflect chronic
microvascular ischemic changes.

Vascular: Atherosclerotic vascular calcification of the carotid
siphons. No hyperdense vessel.

Skull: Normal. Negative for fracture or focal lesion.

Sinuses/Orbits: No acute finding.

Other: None.
IMPRESSION: 1.  No acute intracranial abnormality.
2. Mild generalized cerebral atrophy and chronic microvascular
ischemic changes.
